# Patient Record
Sex: Male | Born: 1960 | Race: White | Hispanic: No | Marital: Married | Smoking: Former smoker
Health system: Southern US, Community
[De-identification: ages and names within clinical notes are randomized; demographics above are authoritative.]

## PROBLEM LIST (undated history)

## (undated) DIAGNOSIS — IMO0002 Reserved for concepts with insufficient information to code with codable children: Secondary | ICD-10-CM

## (undated) DIAGNOSIS — M5020 Other cervical disc displacement, unspecified cervical region: Secondary | ICD-10-CM

## (undated) DIAGNOSIS — F419 Anxiety disorder, unspecified: Secondary | ICD-10-CM

## (undated) DIAGNOSIS — Z0000 Encounter for general adult medical examination without abnormal findings: Secondary | ICD-10-CM

## (undated) DIAGNOSIS — K219 Gastro-esophageal reflux disease without esophagitis: Secondary | ICD-10-CM

## (undated) DIAGNOSIS — K625 Hemorrhage of anus and rectum: Secondary | ICD-10-CM

## (undated) HISTORY — DX: Encounter for general adult medical examination without abnormal findings: Z00.00

## (undated) HISTORY — PX: OTHER SURGICAL HISTORY: SHX169

## (undated) HISTORY — DX: Reserved for concepts with insufficient information to code with codable children: IMO0002

## (undated) HISTORY — DX: Hemorrhage of anus and rectum: K62.5

## (undated) HISTORY — PX: TONSILLECTOMY AND ADENOIDECTOMY: SUR1326

## (undated) HISTORY — DX: Gastro-esophageal reflux disease without esophagitis: K21.9

## (undated) HISTORY — DX: Other cervical disc displacement, unspecified cervical region: M50.20

## (undated) HISTORY — DX: Anxiety disorder, unspecified: F41.9

---

## 2003-10-30 ENCOUNTER — Inpatient Hospital Stay (HOSPITAL_COMMUNITY): Admission: AD | Admit: 2003-10-30 | Discharge: 2003-10-31 | Payer: Self-pay | Admitting: Cardiology

## 2005-06-14 IMAGING — CR DG CHEST 2V
2 series · 2 of 2 positions shown · non-contrast
Comparison: none

CLINICAL DATA: Chest pain.  Negative cardiac cath.
 NUCLEAR MEDICINE VENTILATION PERFUSION LUNG SCAN
 Perfusion study was done utilizing 3.1 millicuries OcMMm MAA.  There are no segmental or subsegmental defect that would suggest pulmonary embolic disease.  Because the perfusion study was normal, no ventilation study was done.
 IMPRESSION 
 Normal perfusion lung scan.
 TWO-VIEW CHEST
CLINICAL DATA: Chest pain.
 Heart and lungs normal.  Osseous structures intact.  No pleural fluid.
 No active disease.

[view not recorded (1 of 2)]
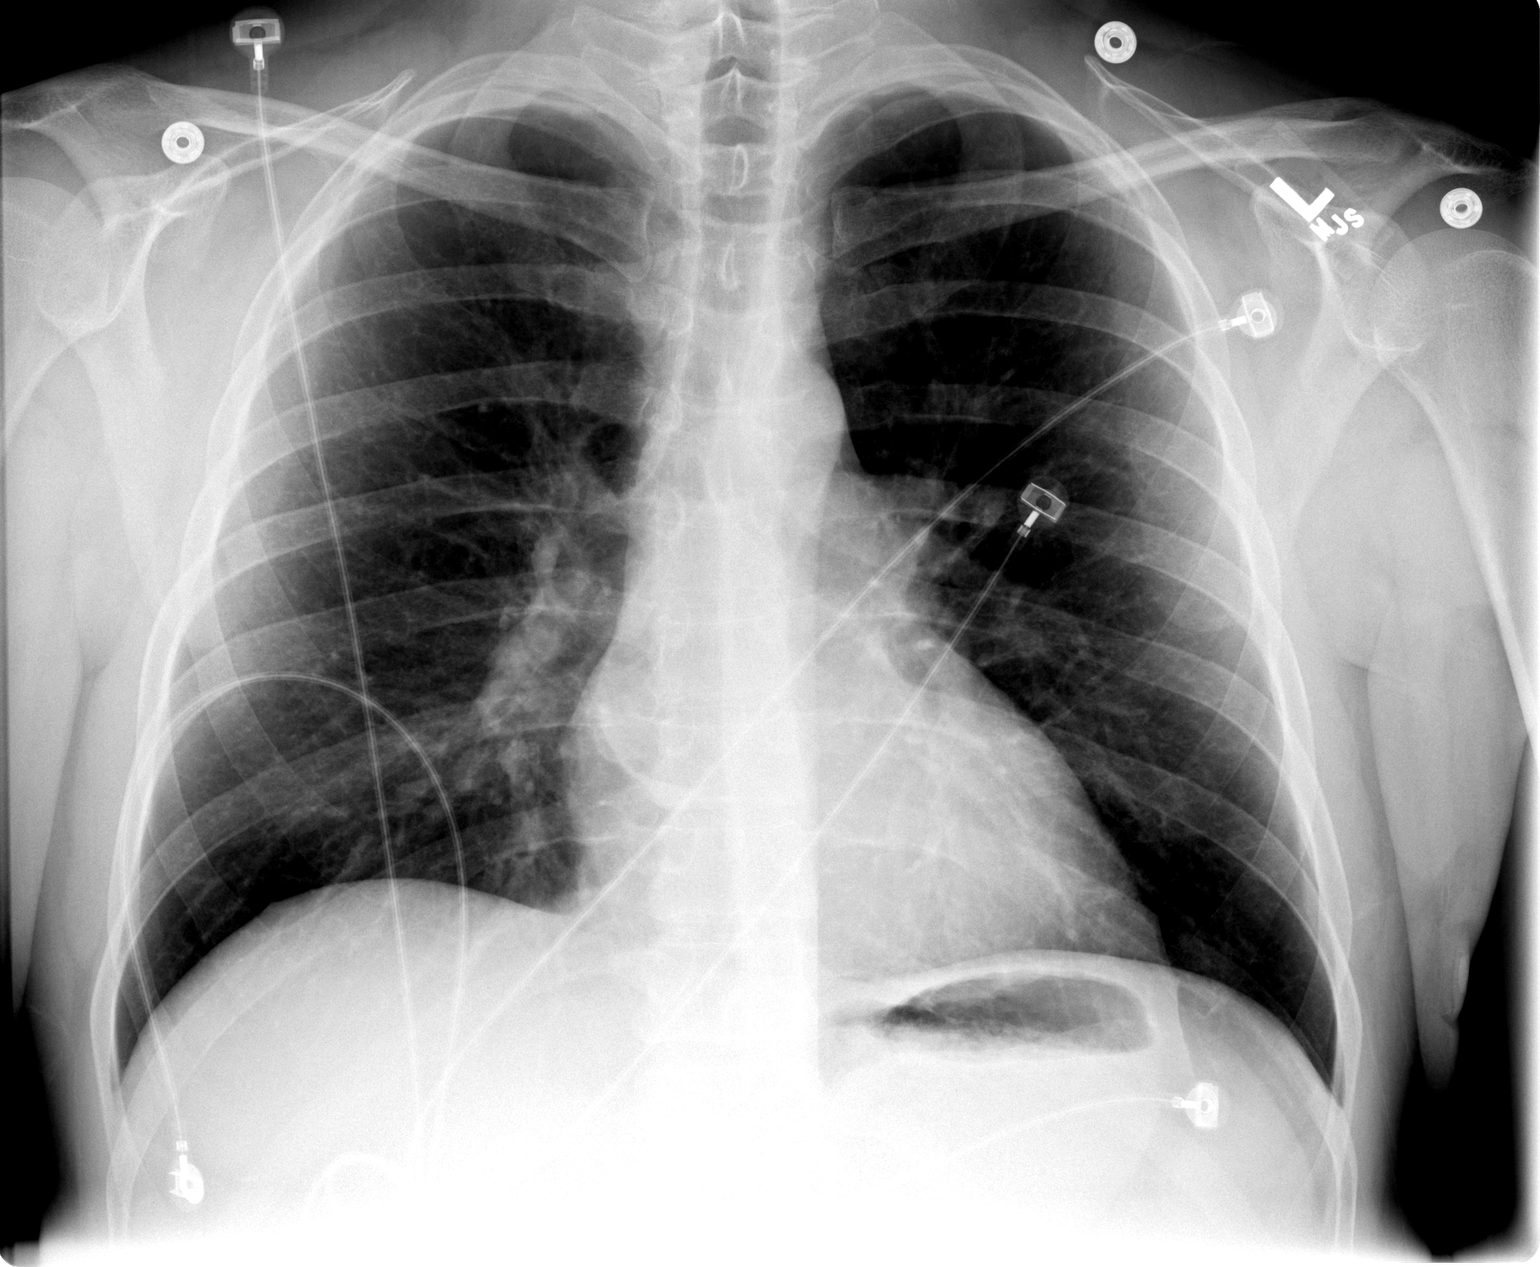

[view not recorded (2 of 2)]
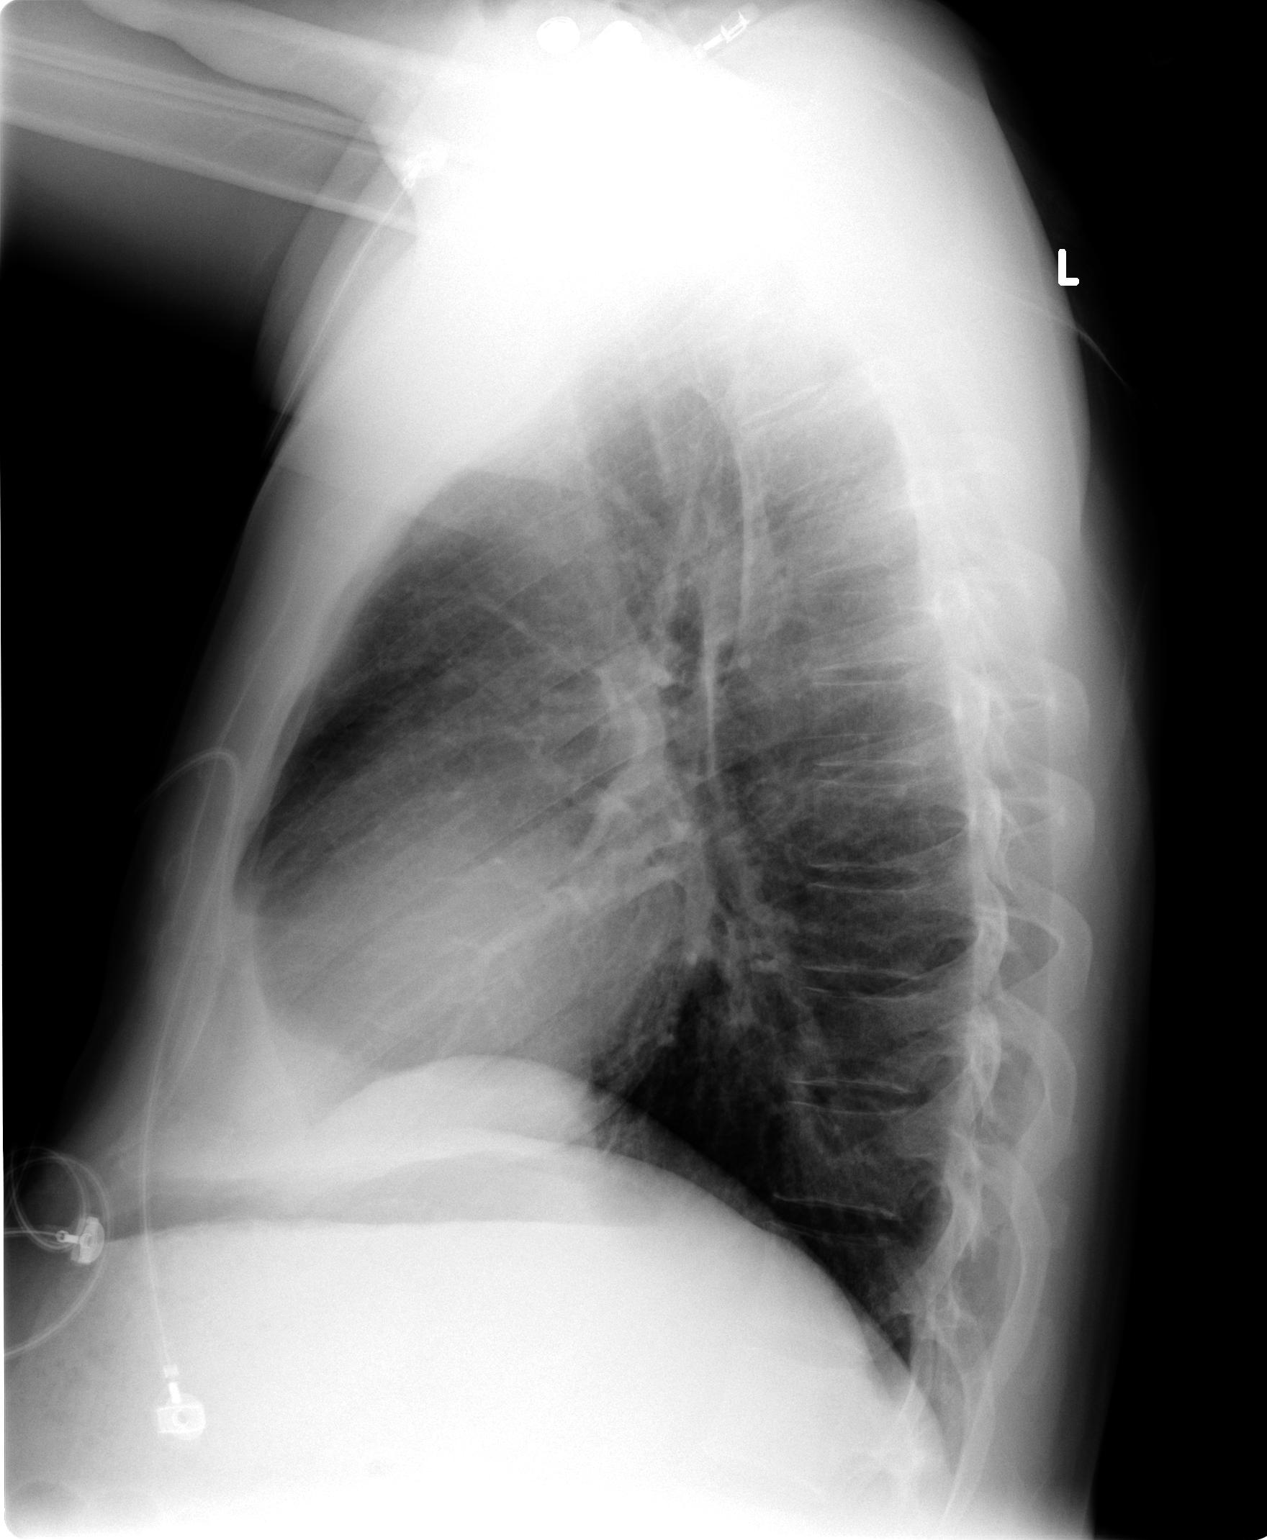

[2 of 2 positions shown; findings below may reference images not displayed]

## 2005-07-07 HISTORY — PX: CARDIAC CATHETERIZATION: SHX172

## 2010-11-23 ENCOUNTER — Encounter: Payer: Self-pay | Admitting: Physician Assistant

## 2010-11-27 ENCOUNTER — Ambulatory Visit (INDEPENDENT_AMBULATORY_CARE_PROVIDER_SITE_OTHER): Payer: PRIVATE HEALTH INSURANCE | Admitting: Physician Assistant

## 2010-11-27 ENCOUNTER — Encounter: Payer: Self-pay | Admitting: Physician Assistant

## 2010-11-27 DIAGNOSIS — R079 Chest pain, unspecified: Secondary | ICD-10-CM

## 2010-11-27 NOTE — Progress Notes (Signed)
Exercise Treadmill Test  Pre-Exercise Testing Evaluation Rhythm: normal sinus  Rate: 67   PR:  .16 QRS:  .08  QT:  .39 QTc: .42     Test  Exercise Tolerance Test Ordering MD: Desmond Dike M.D  Interpreting MD:  Tereso Newcomer PA-C  Unique Test No: 1  Treadmill:  1  Indication for ETT: chest pain - rule out ischemia  Contraindication to ETT: No   Stress Modality: exercise - treadmill  Cardiac Imaging Performed: non   Protocol: standard Bruce - maximal  Max BP: 222/101  Max MPHR (bpm):  172 85% MPR (bpm):  146  MPHR obtained (bpm):  160 % MPHR obtained: 93%  Reached 85% MPHR (min:sec):  6:37 Total Exercise Time (min-sec):  7:31  Workload in METS:  10.9 Borg Scale: 16  Reason ETT Terminated:  desired heart rate attained    ST Segment Analysis At Rest: normal ST segments - no evidence of significant ST depression With Exercise: no evidence of significant ST depression  Other Information Arrhythmia:  No Angina during ETT:   Quality of ETT:  diagnostic  ETT Interpretation:  normal - no evidence of ischemia by ST analysis  Comments: Good exercise tolerance. Hypertensive BP response. Patient complained of chest burning during exercise. No ST-T changes to suggest ischemia.   Recommendations: Follow up with Dr. Sheria Lang. Suggest he watch salt and increase activity to help with BP.

## 2017-12-08 DIAGNOSIS — Z125 Encounter for screening for malignant neoplasm of prostate: Secondary | ICD-10-CM | POA: Diagnosis not present

## 2017-12-08 DIAGNOSIS — Z1159 Encounter for screening for other viral diseases: Secondary | ICD-10-CM | POA: Diagnosis not present

## 2017-12-08 DIAGNOSIS — Z0000 Encounter for general adult medical examination without abnormal findings: Secondary | ICD-10-CM | POA: Diagnosis not present

## 2018-01-11 DIAGNOSIS — L309 Dermatitis, unspecified: Secondary | ICD-10-CM | POA: Insufficient documentation

## 2018-09-07 DIAGNOSIS — R12 Heartburn: Secondary | ICD-10-CM | POA: Diagnosis not present

## 2018-09-07 DIAGNOSIS — R142 Eructation: Secondary | ICD-10-CM | POA: Diagnosis not present

## 2021-02-28 DIAGNOSIS — M6702 Short Achilles tendon (acquired), left ankle: Secondary | ICD-10-CM | POA: Insufficient documentation

## 2021-02-28 DIAGNOSIS — M722 Plantar fascial fibromatosis: Secondary | ICD-10-CM | POA: Insufficient documentation

## 2021-10-22 ENCOUNTER — Ambulatory Visit (INDEPENDENT_AMBULATORY_CARE_PROVIDER_SITE_OTHER): Payer: Commercial Managed Care - PPO

## 2021-10-22 ENCOUNTER — Other Ambulatory Visit: Payer: Self-pay | Admitting: Podiatrist

## 2021-10-22 ENCOUNTER — Encounter: Payer: Self-pay | Admitting: Podiatrist

## 2021-10-22 ENCOUNTER — Ambulatory Visit: Payer: Commercial Managed Care - PPO | Admitting: Podiatrist

## 2021-10-22 DIAGNOSIS — M722 Plantar fascial fibromatosis: Secondary | ICD-10-CM

## 2021-10-22 DIAGNOSIS — M7752 Other enthesopathy of left foot: Secondary | ICD-10-CM

## 2021-10-22 DIAGNOSIS — M7750 Other enthesopathy of unspecified foot: Secondary | ICD-10-CM

## 2021-10-22 MED ORDER — MELOXICAM 15 MG PO TABS: 15.0000 mg | ORAL_TABLET | Freq: Every day | ORAL | 2 refills | Status: AC

## 2021-10-22 MED ORDER — TRIAMCINOLONE ACETONIDE 10 MG/ML IJ SUSP: 10.0000 mg | Freq: Once | INTRAMUSCULAR | Status: AC

## 2021-10-22 NOTE — Patient Instructions (Signed)

## 2021-10-22 NOTE — Progress Notes (Signed)
? ? ?HPI: Patient is 61 y.o. male who presents today for severe pain in his left foot.  He has been having this pain on and off for the last 5 years. He relates he saw another podiatrist about 6 months ago and received a cortizone injection. He relates this helped relieve the pain, however it returned.  He works on English as a second language teacher at [employer].  He also states pain with the first step or after sitting for long periods.   ? ?Patient Active Problem List  ? Diagnosis Date Noted  ? Plantar fasciitis 02/28/2021  ? Tightness of heel cord, left 02/28/2021  ? 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 ? ?Current Outpatient Medications on File Prior to Visit  ?Medication Sig Dispense Refill  ? clobetasol ointment (TEMOVATE) 0.05 % Apply to hands twice daily for 2 wks, then daily for 2 wks, then every other day x 2 wk. Not to face.    ? triamcinolone ointment (KENALOG) 0.1 % Apply to hands/fingers twice daily for 2 wks, then daily for 2 wks, then every other day for 2 wks. Not to face.    ? ALPRAZolam (XANAX) 0.25 MG tablet 1 TABLET PRN     ? cyclobenzaprine (FLEXERIL) 5 MG tablet Take 5 mg by mouth 3 (three) times daily as needed.    ? meloxicam (MOBIC) 15 MG tablet Take 15 mg by mouth daily.    ? PARoxetine (PAXIL) 20 MG tablet Take 20 mg by mouth daily.      ? pravastatin (PRAVACHOL) 40 MG tablet Take 40 mg by mouth daily.    ? ?No current facility-administered medications on file prior to visit.  ? ? ?No Known Allergies ? ?Review of Systems ?No fevers, chills, nausea, muscle aches, no difficulty breathing, no calf pain, no chest pain or shortness of breath. ? ? ?Physical Exam ? ?GENERAL APPEARANCE: Alert, conversant. Appropriately groomed. No acute distress.  ? ?VASCULAR: Pedal pulses palpable DP and PT bilateral.  Capillary refill time is immediate to all digits,  Proximal to distal cooling it warm to warm.  Digital perfusion adequate.  ? ?NEUROLOGIC: sensation is intact to 5.07 monofilament at 5/5 sites bilateral.  Light  touch is intact bilateral, vibratory sensation intact bilateral.  Negative Tinel's sign is elicited left. ? ?MUSCULOSKELETAL: acceptable muscle strength, tone and stability bilateral.  No gross boney pedal deformities noted.  Moderate high arches noted bilateral.  Pain on palpation the insertion of the plantar fascia of the left foot is noted.  Inflammation in the insertion site is also seen. ? ?DERMATOLOGIC: skin is warm, supple, and dry.  Color, texture, and turgor of skin within normal limits.  No open wounds are noted.  No preulcerative lesions are seen.  Digital nails are asymptomatic.   ? ?X-ray evaluation 3 views of the left foot are obtained.  No sign of fracture or dislocation is noted. no acute osseous abnormalities are seen. ? ?Assessment  ? ?  ICD-10-CM   ?1. Plantar fasciitis of left foot  M72.2 DG Foot Complete Right  ?  ? ? ? ?Plan ? ?Discussed exam findings as well as treatment options and alternatives with the patient and his wife.  I recommended a second steroid injection to the left heel and the patient agreed I prepped the skin with alcohol infiltrated 10 mg of Kenalog with Marcaine plain into the insertion of the plantar fascia without complication.  I recommended stretching exercises as well as an anti-inflammatory medication and meloxicam was called into his  pharmacy.  Also discussed the long-term benefits of custom orthotics and he saw Arlys John today to discuss.  Be seen back in 6 weeks for recheck in our [place] office and if any problems questions or concerns arise prior to that visit he is instructed to call.  ?

## 2021-10-22 NOTE — Progress Notes (Signed)
SITUATION ?Reason for Consult: Evaluation for Bilateral Custom Foot Orthoses ?Patient / Caregiver Report: Patient is ready for foot orthotics ? ?OBJECTIVE DATA: ?Patient History / Diagnosis:  ?  ICD-10-CM   ?1. Plantar fasciitis of left foot  M72.2   ?  ? ? ?Current or Previous Devices:   None and no history ? ?Foot Examination: ?Skin presentation:   Intact ?Ulcers & Callousing:   None ?Toe / Foot Deformities:  None ?Weight Bearing Presentation:  Rectus ?Sensation:    Intact ? ?Shoe Size:    10.109M ? ?ORTHOTIC RECOMMENDATION ?Recommended Device: 1x pair of custom functional foot orthotics ? ?GOALS OF ORTHOSES ?- Reduce Pain ?- Prevent Foot Deformity ?- Prevent Progression of Further Foot Deformity ?- Relieve Pressure ?- Improve the Overall Biomechanical Function of the Foot and Lower Extremity. ? ?ACTIONS PERFORMED ?Potential out of pocket cost was communicated to patient. Patient understood and consent to casting. Patient was casted for Foot Orthoses via crush box. Procedure was explained and patient tolerated procedure well. Casts were shipped to central fabrication. All questions were answered and concerns addressed. ? ?PLAN ?Patient is to be called for fitting when devices are ready.  ? ? ?

## 2021-11-13 ENCOUNTER — Telehealth: Payer: Self-pay

## 2021-11-13 NOTE — Telephone Encounter (Signed)
Left message on vm for appointment to pick up orthotic

## 2021-11-13 NOTE — Telephone Encounter (Signed)
Foot orthotics ready - left voicemail for patient to call back and schedule appointment ?

## 2021-11-14 ENCOUNTER — Ambulatory Visit: Payer: Commercial Managed Care - PPO

## 2021-11-14 DIAGNOSIS — M722 Plantar fascial fibromatosis: Secondary | ICD-10-CM

## 2021-11-14 NOTE — Progress Notes (Signed)
